# Patient Record
Sex: Male | Born: 1980 | Race: White | Hispanic: No | Marital: Single | Smoking: Current every day smoker
Health system: Southern US, Community
[De-identification: ages and names within clinical notes are randomized; demographics above are authoritative.]

## PROBLEM LIST (undated history)

## (undated) DIAGNOSIS — C8310 Mantle cell lymphoma, unspecified site: Secondary | ICD-10-CM

## (undated) DIAGNOSIS — C8370 Burkitt lymphoma, unspecified site: Secondary | ICD-10-CM

## (undated) HISTORY — PX: OTHER SURGICAL HISTORY: SHX169

---
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
## 1999-01-05 ENCOUNTER — Emergency Department (HOSPITAL_COMMUNITY): Admission: EM | Admit: 1999-01-05 | Discharge: 1999-01-05 | Payer: Self-pay | Admitting: Emergency Medicine

## 1999-01-05 ENCOUNTER — Encounter: Payer: Self-pay | Admitting: Emergency Medicine

## 1999-01-20 ENCOUNTER — Encounter: Admission: RE | Admit: 1999-01-20 | Discharge: 1999-01-20 | Payer: Self-pay | Admitting: Family Medicine

## 1999-11-21 ENCOUNTER — Emergency Department (HOSPITAL_COMMUNITY): Admission: EM | Admit: 1999-11-21 | Discharge: 1999-11-21 | Payer: Self-pay | Admitting: *Deleted

## 1999-11-21 ENCOUNTER — Encounter: Payer: Self-pay | Admitting: Family Medicine

## 1999-12-15 ENCOUNTER — Encounter: Admission: RE | Admit: 1999-12-15 | Discharge: 1999-12-15 | Payer: Self-pay | Admitting: Family Medicine

## 2000-09-13 ENCOUNTER — Encounter: Payer: Self-pay | Admitting: Emergency Medicine

## 2000-09-13 ENCOUNTER — Emergency Department (HOSPITAL_COMMUNITY): Admission: EM | Admit: 2000-09-13 | Discharge: 2000-09-13 | Payer: Self-pay | Admitting: Emergency Medicine

## 2002-07-07 ENCOUNTER — Emergency Department (HOSPITAL_COMMUNITY): Admission: EM | Admit: 2002-07-07 | Discharge: 2002-07-07 | Payer: Self-pay | Admitting: *Deleted

## 2012-02-21 ENCOUNTER — Encounter (HOSPITAL_COMMUNITY): Payer: Self-pay | Admitting: Emergency Medicine

## 2012-02-21 ENCOUNTER — Emergency Department (HOSPITAL_COMMUNITY)
Admission: EM | Admit: 2012-02-21 | Discharge: 2012-02-22 | Disposition: A | Discharge status: Home / Self Care | Payer: Self-pay | Attending: Emergency Medicine | Admitting: Emergency Medicine

## 2012-02-21 DIAGNOSIS — F17200 Nicotine dependence, unspecified, uncomplicated: Secondary | ICD-10-CM | POA: Insufficient documentation

## 2012-02-21 DIAGNOSIS — R070 Pain in throat: Secondary | ICD-10-CM | POA: Insufficient documentation

## 2012-02-21 DIAGNOSIS — R599 Enlarged lymph nodes, unspecified: Secondary | ICD-10-CM | POA: Insufficient documentation

## 2012-02-21 DIAGNOSIS — J36 Peritonsillar abscess: Secondary | ICD-10-CM | POA: Insufficient documentation

## 2012-02-21 DIAGNOSIS — Z8589 Personal history of malignant neoplasm of other organs and systems: Secondary | ICD-10-CM | POA: Insufficient documentation

## 2012-02-21 NOTE — ED Notes (Signed)
PT. REPORTS SORE THROAT / SWELLING ONSET TODAY , RESPIRATIONS UNLABORED , DENIES FEVER OR CHILLS.

## 2012-02-22 ENCOUNTER — Emergency Department (HOSPITAL_COMMUNITY): Payer: Self-pay

## 2012-02-22 LAB — BASIC METABOLIC PANEL
BUN: 7 mg/dL (ref 6–23)
CO2: 26 mEq/L (ref 19–32)
Calcium: 8.9 mg/dL (ref 8.4–10.5)
Chloride: 102 mEq/L (ref 96–112)
Creatinine, Ser: 0.84 mg/dL (ref 0.50–1.35)
GFR calc Af Amer: >90 mL/min (ref >90)
GFR calc non Af Amer: >90 mL/min (ref >90)
Glucose, Bld: 105 mg/dL — ABNORMAL HIGH (ref 70–99)
Potassium: 3.3 mEq/L — ABNORMAL LOW (ref 3.5–5.1)
Sodium: 137 mEq/L (ref 135–145)

## 2012-02-22 LAB — CBC
HCT: 36.4 % — ABNORMAL LOW (ref 39.0–52.0)
Hemoglobin: 12.8 g/dL — ABNORMAL LOW (ref 13.0–17.0)
MCH: 31.2 pg (ref 26.0–34.0)
MCHC: 35.2 g/dL (ref 30.0–36.0)
MCV: 88.8 fL (ref 78.0–100.0)
Platelets: 266 10*3/uL (ref 150–400)
RBC: 4.1 MIL/uL — ABNORMAL LOW (ref 4.22–5.81)
RDW: 13.7 % (ref 11.5–15.5)
WBC: 26.1 10*3/uL — ABNORMAL HIGH (ref 4.0–10.5)

## 2012-02-22 MED ORDER — OXYCODONE-ACETAMINOPHEN 5-325 MG PO TABS: 1.0000 | ORAL_TABLET | ORAL | Status: AC | PRN

## 2012-02-22 MED ORDER — CLINDAMYCIN PHOSPHATE 600 MG/50ML IV SOLN
INTRAVENOUS | Status: AC
  Filled 2012-02-22: qty 50

## 2012-02-22 MED ORDER — DEXAMETHASONE SODIUM PHOSPHATE 10 MG/ML IJ SOLN
10.0000 mg | Freq: Once | INTRAMUSCULAR | Status: AC
  Administered 2012-02-22: 10 mg via INTRAVENOUS
  Filled 2012-02-22: qty 1

## 2012-02-22 MED ORDER — IOHEXOL 300 MG/ML  SOLN
75.0000 mL | Freq: Once | INTRAMUSCULAR | Status: AC | PRN
  Administered 2012-02-22: 75 mL via INTRAVENOUS

## 2012-02-22 MED ORDER — KETOROLAC TROMETHAMINE 15 MG/ML IJ SOLN
15.0000 mg | INTRAMUSCULAR | Status: DC
  Filled 2012-02-22: qty 1

## 2012-02-22 MED ORDER — CLINDAMYCIN PHOSPHATE 900 MG/50ML IV SOLN
900.0000 mg | INTRAVENOUS | Status: AC
  Administered 2012-02-22: 900 mg via INTRAVENOUS
  Filled 2012-02-22 (×2): qty 50

## 2012-02-22 MED ORDER — IBUPROFEN 400 MG PO TABS: 400.0000 mg | ORAL_TABLET | Freq: Four times a day (QID) | ORAL | Status: AC | PRN

## 2012-02-22 MED ORDER — CLINDAMYCIN HCL 150 MG PO CAPS: 300.0000 mg | ORAL_CAPSULE | Freq: Three times a day (TID) | ORAL | Status: AC

## 2012-02-22 MED ORDER — SODIUM CHLORIDE 0.9 % IV BOLUS (SEPSIS)
1000.0000 mL | Freq: Once | INTRAVENOUS | Status: AC
  Administered 2012-02-22: 1000 mL via INTRAVENOUS

## 2012-02-22 MED ORDER — KETOROLAC TROMETHAMINE 30 MG/ML IJ SOLN
INTRAMUSCULAR | Status: AC
  Administered 2012-02-22: 15 mg
  Filled 2012-02-22: qty 1

## 2012-02-22 MED ORDER — HYDROMORPHONE HCL PF 1 MG/ML IJ SOLN
1.0000 mg | Freq: Once | INTRAMUSCULAR | Status: AC
  Administered 2012-02-22: 1 mg via INTRAVENOUS
  Filled 2012-02-22: qty 1

## 2012-02-22 NOTE — ED Provider Notes (Signed)
History    31 year old male with sore throat. Gradual onset about a day ago. Progressively worsening. No fevers or chills. Denies shortness of breath.  Decreased PO because of pain. No n/v. Is a smoker. No drooling.  CSN: [id number]  Arrival date & time 02/21/12  2255   First MD Initiated Contact with Patient 02/22/12 0134      Chief Complaint  Patient presents with  . Sore Throat    (Consider location/radiation/quality/duration/timing/severity/associated sxs/prior treatment) HPI  Past Medical History  Diagnosis Date  . Spleen cancer     Past Surgical History  Procedure Date  . Spleenec     No family history on file.  History  Substance Use Topics  . Smoking status: Current Everyday Smoker  . Smokeless tobacco: Not on file  . Alcohol Use: Yes      Review of Systems   Review of symptoms negative unless otherwise noted in HPI.   Allergies  Review of patient's allergies indicates no known allergies.  Home Medications   Current Outpatient Rx  Name Route Sig Dispense Refill  . ACETAMINOPHEN 325 MG PO TABS Oral Take 650 mg by mouth every 6 (six) hours as needed. For pain    . AMOXICILLIN 500 MG PO CAPS Oral Take 500 mg by mouth 2 (two) times daily.      BP 129/77  Pulse 102  Temp(Src) 99.8 F (37.7 C) (Oral)  Resp 20  SpO2 99%  Physical Exam  Nursing note and vitals reviewed. Constitutional: He appears well-developed and well-nourished. No distress.  HENT:  Head: Normocephalic and atraumatic.       Bulging of the right palatopharyngeal arch with deviation of the uvula to the left. Tonsillar hypertrophy with exudate. Patient is handling his secretions. There is no tongue elevation. Submental tissues are soft. Tender cervical adenopathy. Tympanic membranes are clear bilaterally. External auditory canals are clear as well.  Eyes: Conjunctivae are normal. Right eye exhibits no discharge. Left eye exhibits no discharge.  Neck: Neck supple. No JVD present. No  tracheal deviation present.  Cardiovascular: Normal rate, regular rhythm and normal heart sounds.  Exam reveals no gallop and no friction rub.   No murmur heard. Pulmonary/Chest: Effort normal and breath sounds normal. No stridor. No respiratory distress.       Lungs are clear respiratory effort is normal.  Abdominal: Soft. He exhibits no distension. There is no tenderness.  Musculoskeletal: He exhibits no edema and no tenderness.  Lymphadenopathy:    He has cervical adenopathy.  Neurological: He is alert.  Skin: Skin is warm and dry.  Psychiatric: He has a normal mood and affect. His behavior is normal. Thought content normal.    ED Course  Procedures (including critical care time)  Labs Reviewed  BASIC METABOLIC PANEL - Abnormal; Notable for the following:    Potassium 3.3 (*)    Glucose, Bld 105 (*)    All other components within normal limits  CBC - Abnormal; Notable for the following:    WBC 26.1 (*)    RBC 4.10 (*)    Hemoglobin 12.8 (*)    HCT 36.4 (*)    All other components within normal limits   No results found.  Ct Soft Tissue Neck W Contrast  02/22/2012  *RADIOLOGY REPORT*  Clinical Data: Palpable mass lateral right neck, sore throat, right tonsillar enlargement/swelling with redness, question peritonsillar abscess  CT NECK WITH CONTRAST  Technique:  Multidetector CT imaging of the neck was performed with intravenous contrast. Sagittal  and coronal MPR images reconstructed from axial data set.  Contrast: 75mL OMNIPAQUE IOHEXOL 300 MG/ML IJ SOLN  Comparison: None  Findings: Intracranial structures unremarkable. Question mucosal retention cyst versus polyps left maxillary sinus. Significant enlargement of parapharyngeal lymphoid tissue, right greater than left. Effacement of right peritonsillar/parapharyngeal space soft tissue planes compatible with edema/phlegmon. No discrete fluid collection or enhancing margins are identified to suggest a mature drainable peritonsillar  abscess. Reactive lymph nodes in the cervical regions bilaterally, normal- sized and enlarged. Prevertebral soft tissues normal thickness.  No other abnormal mass or fluid collection identified. Edema seen within the soft tissues in the right cervical region without discrete abscess, deep to the platysma. Symmetric submandibular and parotid glands. Unremarkable thyroid lobes. Lung apices clear.  IMPRESSION: Significant enlargement of parapharyngeal soft tissues right greater than left with phlegmon in the right parapharyngeal space. No discrete drainable abscess collection identified. Extensive reactive adenopathy with scattered edema within deep soft tissues of the right neck.  Findings called to Dr. Juleen China on 02/22/2012 at 0318 hrs.  Original Report Authenticated By: Lollie Marrow, M.D.   3:20 AM Discuss case with radiology. Patient has phlegmon, but no drainable abscess at this time.  1. Peritonsillar cellulitis       MDM  31 year old male with sore throat. There was a concern given exam for possible peritonsillar abscess. CT was performed. There is a peritonsillar cellulitis, but no discrete drainable abscess. Patient reports improvement of symptoms with meds. Patient is handling his secretions and is experiencing no respiratory distress. Dose of clindamycin was given. Prescription for continued antibiotics. Pain medication as needed. Return precautions were discussed. Outpatient followup.        Raeford Razor, MD 02/28/12 1755

## 2012-02-22 NOTE — ED Notes (Signed)
Annabelle Harman (pt's significant other) [phone]

## 2012-02-22 NOTE — ED Notes (Signed)
Palpable mass to rgt lateral neck area - states onset last PM; reports sore throat with "white patches" x1 week; recent sick contacts; upon visualization, rgt tonsillar area extremely swollen with cherry redness noted; reports tactile fevers at home; however, did not check via thermometer; airway intact; able to control oral secretions; in no resp distress at this time

## 2012-02-22 NOTE — Discharge Instructions (Signed)
Peritonsillar Cellulitis Peritonsillar cellulitis is an infection around a tonsil. This infection usually affects just one of the two tonsils. The result is a severe sore throat. Peritonsillar cellulitis can develop at any age. It often develops in individuals who have had frequent sore throats and who have frequently taken antibiotics. CAUSES  Peritonsillar cellulitis is usually caused by more than one type of germ (bacteria). SYMPTOMS  At first, it might seem like a regular sore throat. But a sore throat from peritonsillar cellulitis does not go away in a few days. Instead, it gets worse.  Early symptoms of peritonsillar cellulitis may include:   Fever and/or chills.   A throat that is sore on one side only.   Pain in one ear.   Pain when swallowing.   Feeling more tired than usual.   Later symptoms may include:   Severe pain when swallowing.   Drooling.   Trouble opening the mouth wide.   Bad breath.   Voice changes.  DIAGNOSIS  In most cases, your caregiver can make the diagnosis by knowing your symptoms, examining your throat and getting a throat culture. Blood samples may also help to determine the cause of your sore throat. TREATMENT  This is not an ordinary sore throat. It is a condition that needs to be treated quickly. If it is not treated, swelling and pus (an abcess) can develop.  Peritonsillar cellulitis is usually treated with antibiotics. These infections require oral antibiotics for a full 10 days and/or antibiotics given into the vein (intravenous, IV).   Medications may be prescribed for pain or fever.   Sometimes, medications that fight swelling (steroids) are prescribed.   If an abscess has formed, the abscess may need to be drained.   Individuals who have repeated cases of peritonsillar cellulitis may need an operation to remove the tonsils (tonsillectomy).  HOME CARE INSTRUCTIONS   Take antibiotics as directed by your caregiver. Take all the  antibiotics, even if you start to feel better.   Some pain is normal with this condition. Take pain medication as directed by your caregiver. Do not take any other pain medications unless approved by your caregiver.   Gargle with warm salt water. Use 1 teaspoon (5 grams) salt mixed in 1 cup (250 mL) of warm water. Gargle for 30 seconds or more before spitting the solution out. Gargle 3 to 4 times a day or as needed. This may help ease pain and swelling.   A liquid or soft food diet may be necessary if it is hard to swallow.   It is important to drink fluids. Drink enough water and fluids to keep your urine clear or pale yellow.   Do not smoke.   Rest and get plenty of sleep.   If your caregiver has given you a follow-up appointment, it is very important to keep that appointment. Your caregiver will need to make sure that the infection is getting better. It is important to check that an abscess is not forming.   Return to work or school as directed by your caregiver.  SEEK MEDICAL CARE IF:   Your swelling increases.   You have difficulty swallowing.   You are unable to take your antibiotic.  SEEK IMMEDIATE MEDICAL CARE IF:   You have trouble breathing.   Your pain gets worse even after taking pain medicine.   You see pus around or near the tonsils.   Your voice changes.   You are drooling.   You cough up bloody  sputum.   You are unable to swallow.   You have a fever.  MAKE SURE YOU:   Understand these instructions.   Will watch your condition.   Will get help right away if you are not doing well or get worse.  Document Released: 03/02/2010 Document Revised: 11/25/2011 Document Reviewed: 03/02/2010 [facility] Patient Information 2012 [place], [place].  RESOURCE GUIDE  Dental Problems  Patients with Medicaid: [facility] [phone] [address]                                           [phone] [address] Phone:   [phone]                                                  Phone:  [phone]  If unable to pay or uninsured, contact:  [facility] or [facility]. to become qualified for the adult dental clinic.  Chronic Pain Problems Contact [facility]  [phone] Patients need to be referred by their primary care doctor.  Insufficient Money for Medicine Contact [employer]:  call "211" or [facility] 4454877571.  No Primary Care Doctor Call [facility]  724-404-1722 Other agencies that provide inexpensive medical care    [facility]  [phone]    [facility]  [phone]    [facility]  [phone]    [facility]  [phone]    [facility]  [phone]    [facility]  [phone]  Psychological Services [facility]  [phone] [facility]  [phone] [facility]   [phone] (emergency services [phone])  Substance Abuse Resources [facility]  [phone] [facility] ([phone] 407-4089 [facility] ([phone] Floydene Flock [phone] Residential & Outpatient Substance Abuse Program  5677447757  Abuse/Neglect [facility] Child Abuse Hotline 339-785-7166 [facility] Child Abuse Hotline 660-629-7024 (After Hours)  Emergency Shelter [facility] 830-495-3560  M[facility] [phone] [facility] [phone]  MRSA Hotline #:   570-590-6474    [facility] Resources  [facility]     [employer]                          [facility]. [address]                        [address]      [address]                                                  Cristobal Goldmann Phone:  [phone]  Phone:  [phone]                 Phone:   [phone]  [facility] Phone:  [phone]  [place] Child Abuse Hotline (336) 342-1394 [phone] (After Hours)   

## 2012-02-22 NOTE — ED Notes (Signed)
Patient transported to CT 

## 2012-05-24 ENCOUNTER — Encounter (HOSPITAL_COMMUNITY): Payer: Self-pay | Admitting: Emergency Medicine

## 2012-05-24 ENCOUNTER — Emergency Department (HOSPITAL_COMMUNITY)
Admission: EM | Admit: 2012-05-24 | Discharge: 2012-05-24 | Disposition: A | Discharge status: Home / Self Care | Payer: Self-pay | Attending: Emergency Medicine | Admitting: Emergency Medicine

## 2012-05-24 DIAGNOSIS — K0889 Other specified disorders of teeth and supporting structures: Secondary | ICD-10-CM

## 2012-05-24 DIAGNOSIS — K0381 Cracked tooth: Secondary | ICD-10-CM | POA: Insufficient documentation

## 2012-05-24 DIAGNOSIS — F17200 Nicotine dependence, unspecified, uncomplicated: Secondary | ICD-10-CM | POA: Insufficient documentation

## 2012-05-24 MED ORDER — OXYCODONE-ACETAMINOPHEN 5-325 MG PO TABS: 2.0000 | ORAL_TABLET | ORAL | Status: AC | PRN

## 2012-05-24 MED ORDER — OXYCODONE-ACETAMINOPHEN 5-325 MG PO TABS
2.0000 | ORAL_TABLET | Freq: Once | ORAL | Status: AC
  Administered 2012-05-24: 2 via ORAL
  Filled 2012-05-24: qty 2

## 2012-05-24 MED ORDER — PENICILLIN V POTASSIUM 500 MG PO TABS: 500.0000 mg | ORAL_TABLET | Freq: Four times a day (QID) | ORAL | Status: AC

## 2012-05-24 NOTE — ED Provider Notes (Signed)
History     CSN: [id number]  Arrival date & time 05/24/12  2027   First MD Initiated Contact with Patient 05/24/12 2044      Chief Complaint  Patient presents with  . Dental Pain    (Consider location/radiation/quality/duration/timing/severity/associated sxs/prior treatment) HPI Comments: Right lower molar dental pain for the past 3 days. No history of injury, fever, chills. No chest pain, shortness of breath, difficulty breathing or swallowing. Is not have a dentist. 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  The history is provided by the patient.    Past Medical History  Diagnosis Date  . Spleen cancer     Past Surgical History  Procedure Date  . Spleenec     No family history on file.  History  Substance Use Topics  . Smoking status: Current Everyday Smoker  . Smokeless tobacco: Not on file  . Alcohol Use: Yes      Review of Systems  Constitutional: Negative for fever.  HENT: Positive for dental problem.   Respiratory: Negative for cough, chest tightness and shortness of breath.   Cardiovascular: Negative for chest pain.  Gastrointestinal: Negative for nausea, vomiting and abdominal pain.  Genitourinary: Negative for dysuria and hematuria.  Musculoskeletal: Negative for back pain.  Neurological: Negative for dizziness and headaches.    Allergies  Review of patient's allergies indicates no known allergies.  Home Medications   Current Outpatient Rx  Name Route Sig Dispense Refill  . ACETAMINOPHEN 325 MG PO TABS Oral Take 650 mg by mouth every 6 (six) hours as needed. For pain      BP 129/61  Pulse 83  Temp(Src) 98 F (36.7 C) (Oral)  Resp 16  SpO2 97%  Physical Exam  Constitutional: He is oriented to person, place, and time. He appears well-developed and well-nourished. No distress.  HENT:  Head: Normocephalic and atraumatic.  Mouth/Throat: Oropharynx is clear and moist. No  oropharyngeal exudate.    Eyes: Conjunctivae and EOM are normal. Pupils are equal, round, and reactive to light.  Neck: Normal range of motion. Neck supple.  Cardiovascular: Normal rate, regular rhythm and normal heart sounds.   Pulmonary/Chest: Effort normal and breath sounds normal. No respiratory distress.  Abdominal: Soft. There is no tenderness. There is no rebound and no guarding.  Musculoskeletal: Normal range of motion. He exhibits no edema and no tenderness.  Neurological: He is alert and oriented to person, place, and time. No cranial nerve deficit.  Skin: Skin is warm.    ED Course  Procedures (including critical care time)  Labs Reviewed - No data to display No results found.   No diagnosis found.    MDM  Dental pain secondary to fractured tooth without abscess. No tongue elevation, handling secretions. No evidence of Ludwig angina.  Pain medication, antibiotics, followup with dentistry       Glynn Octave, MD 05/24/12 2106

## 2012-05-24 NOTE — ED Notes (Signed)
C/o toothache x 3 days 

## 2012-05-24 NOTE — Discharge Instructions (Signed)
Dental Pain  A tooth ache may be caused by cavities (tooth decay). Cavities expose the nerve of the tooth to air and hot or cold temperatures. It may come from an infection or abscess (also called a boil or furuncle) around your tooth. It is also often caused by dental caries (tooth decay). This causes the pain you are having.  DIAGNOSIS   Your caregiver can diagnose this problem by exam.  TREATMENT   · If caused by an infection, it may be treated with medications which kill germs (antibiotics) and pain medications as prescribed by your caregiver. Take medications as directed.  · Only take over-the-counter or prescription medicines for pain, discomfort, or fever as directed by your caregiver.  · Whether the tooth ache today is caused by infection or dental disease, you should see your dentist as soon as possible for further care.  SEEK MEDICAL CARE IF:  The exam and treatment you received today has been provided on an emergency basis only. This is not a substitute for complete medical or dental care. If your problem worsens or new problems (symptoms) appear, and you are unable to meet with your dentist, call or return to this location.  SEEK IMMEDIATE MEDICAL CARE IF:   · You have a fever.  · You develop redness and swelling of your face, jaw, or neck.  · You are unable to open your mouth.  · You have severe pain uncontrolled by pain medicine.  MAKE SURE YOU:   · Understand these instructions.  · Will watch your condition.  · Will get help right away if you are not doing well or get worse.  Document Released: 12/06/2005 Document Revised: 11/25/2011 Document Reviewed: 07/24/2008  [employer]® Patient Information ©2012 [employer].

## 2012-05-29 ENCOUNTER — Encounter (HOSPITAL_COMMUNITY): Payer: Self-pay

## 2012-05-29 ENCOUNTER — Emergency Department (HOSPITAL_COMMUNITY)
Admission: EM | Admit: 2012-05-29 | Discharge: 2012-05-29 | Disposition: A | Discharge status: Home / Self Care | Payer: Self-pay | Attending: Emergency Medicine | Admitting: Emergency Medicine

## 2012-05-29 DIAGNOSIS — K0889 Other specified disorders of teeth and supporting structures: Secondary | ICD-10-CM

## 2012-05-29 DIAGNOSIS — R112 Nausea with vomiting, unspecified: Secondary | ICD-10-CM | POA: Insufficient documentation

## 2012-05-29 DIAGNOSIS — Z8589 Personal history of malignant neoplasm of other organs and systems: Secondary | ICD-10-CM | POA: Insufficient documentation

## 2012-05-29 DIAGNOSIS — F17200 Nicotine dependence, unspecified, uncomplicated: Secondary | ICD-10-CM | POA: Insufficient documentation

## 2012-05-29 DIAGNOSIS — K089 Disorder of teeth and supporting structures, unspecified: Secondary | ICD-10-CM | POA: Insufficient documentation

## 2012-05-29 DIAGNOSIS — K08409 Partial loss of teeth, unspecified cause, unspecified class: Secondary | ICD-10-CM | POA: Insufficient documentation

## 2012-05-29 DIAGNOSIS — K08109 Complete loss of teeth, unspecified cause, unspecified class: Secondary | ICD-10-CM | POA: Insufficient documentation

## 2012-05-29 NOTE — ED Notes (Signed)
Pt. Had  Rt., molar removed on Thursday, and has had pain since   Rt. Facial swelling noted

## 2012-05-29 NOTE — Discharge Instructions (Signed)
PLEASE CALL YOUR DENTIST TODAY FOR FURTHER CARE/PAIN CONTROL

## 2012-05-29 NOTE — ED Provider Notes (Signed)
History    This chart was scribed for Austin Gaskins, MD, MD by Austin Townsend. The patient was seen in room STRE4 and the patient's care was started at 12:42PM.   CSN: [id number]  Arrival date & time 05/29/12  1200   First MD Initiated Contact with Patient 05/29/12 1236      Chief Complaint  Patient presents with  . Dental Pain    ( Patient is a 31 y.o. male presenting with tooth pain. The history is provided by the patient.  Dental PainPrimary symptoms do not include fever, shortness of breath or cough.  Additional symptoms include: facial swelling.   Austin Townsend is a 31 y.o. male who presents to the Emergency Department complaining of moderate right lower dental pain 3 days ago. Pt reports that the right lower moalr tooth was extracted 3 days ago and the pain has been constant since then. Pt has been using abx and pain medication without relief. Pt has had vomiting and nausea. Pt had back pain. Denies fever.   Past Medical History  Diagnosis Date  . Spleen cancer     Past Surgical History  Procedure Date  . Spleenec     No family history on file.  History  Substance Use Topics  . Smoking status: Current Everyday Smoker  . Smokeless tobacco: Not on file  . Alcohol Use: Yes      Review of Systems  Constitutional: Negative for fever.  HENT: Positive for facial swelling. Negative for neck pain.   Respiratory: Negative for cough and shortness of breath.   Gastrointestinal: Positive for nausea and vomiting. Negative for diarrhea.  Musculoskeletal: Positive for back pain.    Allergies  Review of patient's allergies indicates no known allergies.  Home Medications   Current Outpatient Rx  Name Route Sig Dispense Refill  . OXYCODONE-ACETAMINOPHEN 5-325 MG PO TABS Oral Take 2 tablets by mouth every 4 (four) hours as needed for pain. 15 tablet 0  . PENICILLIN V POTASSIUM 500 MG PO TABS Oral Take 1 tablet (500 mg total) by mouth 4 (four) times daily. 40 tablet 0     BP 121/83  Pulse 74  Temp(Src) 98.1 F (36.7 C) (Oral)  Resp 16  SpO2 100%  Physical Exam  Nursing note and vitals reviewed.  CONSTITUTIONAL: Well developed/well nourished HEAD AND FACE: Normocephalic/atraumatic EYES: EOMI/PERRL ENMT: Mucous membranes moist, tenderness at right lower molar extraction site, no bleeding, no abscess, no trismus  NECK: supple no meningeal signs SPINE:entire spine nontender CV: S1/S2 noted, no murmurs/rubs/gallops noted LUNGS: Lungs are clear to auscultation bilaterally, no apparent distress ABDOMEN: soft, nontender, no rebound or guarding GU:no cva tenderness NEURO: Pt is awake/alert, moves all extremitiesx4 EXTREMITIES: pulses normal, full ROM SKIN: warm, color normal PSYCH: no abnormalities of mood noted  ED Course  Dental Performed by: Austin Townsend Authorized by: Austin Townsend Consent: Verbal consent obtained. Patient sedated: no Patient tolerance: Patient tolerated the procedure well with no immediate complications. Comments: Dry socket paste placed to right lower molar extraction site, moderate pain relief noted    DIAGNOSTIC STUDIES: Oxygen Saturation is 100% on room air, normal by my interpretation.    COORDINATION OF CARE: 12:46PM EDP discusses pt ED treatment with pt.      MDM  Nursing notes including past medical history, social history and family history reviewed and considered in documentation Previous records reviewed and considered Narcotic database reviewed      I personally performed the services described in this  documentation, which was scribed in my presence. The recorded information has been reviewed and considered.      Austin Gaskins, MD 05/29/12 [phone]

## 2012-08-14 ENCOUNTER — Encounter (HOSPITAL_COMMUNITY): Payer: Self-pay | Admitting: *Deleted

## 2012-08-14 ENCOUNTER — Emergency Department (HOSPITAL_COMMUNITY)
Admission: EM | Admit: 2012-08-14 | Discharge: 2012-08-14 | Disposition: A | Discharge status: Home / Self Care | Payer: Self-pay | Attending: Emergency Medicine | Admitting: Emergency Medicine

## 2012-08-14 DIAGNOSIS — F17200 Nicotine dependence, unspecified, uncomplicated: Secondary | ICD-10-CM | POA: Insufficient documentation

## 2012-08-14 DIAGNOSIS — Z87898 Personal history of other specified conditions: Secondary | ICD-10-CM | POA: Insufficient documentation

## 2012-08-14 DIAGNOSIS — S51819A Laceration without foreign body of unspecified forearm, initial encounter: Secondary | ICD-10-CM

## 2012-08-14 DIAGNOSIS — S51809A Unspecified open wound of unspecified forearm, initial encounter: Secondary | ICD-10-CM | POA: Insufficient documentation

## 2012-08-14 MED ORDER — TRAMADOL HCL 50 MG PO TABS
50.0000 mg | ORAL_TABLET | Freq: Once | ORAL | Status: AC
  Administered 2012-08-14: 50 mg via ORAL
  Filled 2012-08-14: qty 1

## 2012-08-14 MED ORDER — TRAMADOL HCL 50 MG PO TABS: 50.0000 mg | ORAL_TABLET | Freq: Four times a day (QID) | ORAL | Status: AC | PRN
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
## 2012-08-14 NOTE — ED Notes (Signed)
Rx x 1.  Pt voiced understanding to f/u with PCP and return for staple removal in 7-10 days.

## 2012-08-14 NOTE — ED Notes (Signed)
Called for pt with no answer--pt told RN first he was going to step outside for a minute--attempted to locate outside without success; will call again in about 15 minutes

## 2013-01-30 ENCOUNTER — Emergency Department: Payer: Self-pay | Admitting: Unknown Physician Specialty

## 2013-01-30 LAB — URINALYSIS, COMPLETE
Bilirubin,UR: NEGATIVE
Glucose,UR: NEGATIVE mg/dL (ref 0–75)
Ketone: NEGATIVE
Leukocyte Esterase: NEGATIVE
Ph: 5 (ref 4.5–8.0)
Specific Gravity: 1.025 (ref 1.003–1.030)
Squamous Epithelial: <1

## 2013-01-30 LAB — COMPREHENSIVE METABOLIC PANEL
Albumin: 4.1 g/dL (ref 3.4–5.0)
Alkaline Phosphatase: 75 U/L (ref 50–136)
Anion Gap: 7 (ref 7–16)
Calcium, Total: 8.9 mg/dL (ref 8.5–10.1)
Chloride: 107 mmol/L (ref 98–107)
Creatinine: 1.1 mg/dL (ref 0.60–1.30)
EGFR (Non-African Amer.): >60
Glucose: 87 mg/dL (ref 65–99)
Osmolality: 283 (ref 275–301)
Potassium: 3.5 mmol/L (ref 3.5–5.1)
SGPT (ALT): 31 U/L (ref 12–78)

## 2013-01-30 LAB — CBC WITH DIFFERENTIAL/PLATELET
Basophil %: 0.3 %
Eosinophil #: 0.1 10*3/uL (ref 0.0–0.7)
Eosinophil %: 0.3 %
Lymphocyte %: 12 %
MCH: 30.2 pg (ref 26.0–34.0)
MCHC: 33.4 g/dL (ref 32.0–36.0)
Monocyte %: 7 %
Neutrophil %: 80.4 %
Platelet: 228 10*3/uL (ref 150–440)
RBC: 4.55 10*6/uL (ref 4.40–5.90)
RDW: 14.2 % (ref 11.5–14.5)

## 2013-01-30 LAB — DRUG SCREEN, URINE
Amphetamines, Ur Screen: NEGATIVE (ref ?–1000)
Cocaine Metabolite,Ur ~~LOC~~: NEGATIVE (ref ?–300)
Methadone, Ur Screen: NEGATIVE (ref ?–300)
Tricyclic, Ur Screen: NEGATIVE (ref ?–1000)

## 2013-01-30 LAB — LIPASE, BLOOD: Lipase: 133 U/L (ref 73–393)

## 2017-04-04 ENCOUNTER — Emergency Department
Admission: EM | Admit: 2017-04-04 | Discharge: 2017-04-04 | Disposition: A | Discharge status: Home / Self Care | Payer: Self-pay | Attending: Emergency Medicine | Admitting: Emergency Medicine

## 2017-04-04 ENCOUNTER — Telehealth: Payer: Self-pay | Admitting: Emergency Medicine

## 2017-04-04 ENCOUNTER — Encounter: Payer: Self-pay | Admitting: Emergency Medicine

## 2017-04-04 ENCOUNTER — Emergency Department: Payer: Self-pay

## 2017-04-04 DIAGNOSIS — M7989 Other specified soft tissue disorders: Secondary | ICD-10-CM | POA: Insufficient documentation

## 2017-04-04 DIAGNOSIS — F17210 Nicotine dependence, cigarettes, uncomplicated: Secondary | ICD-10-CM | POA: Insufficient documentation

## 2017-04-04 DIAGNOSIS — R220 Localized swelling, mass and lump, head: Secondary | ICD-10-CM | POA: Insufficient documentation

## 2017-04-04 DIAGNOSIS — Z5321 Procedure and treatment not carried out due to patient leaving prior to being seen by health care provider: Secondary | ICD-10-CM | POA: Insufficient documentation

## 2017-04-04 DIAGNOSIS — M549 Dorsalgia, unspecified: Secondary | ICD-10-CM | POA: Insufficient documentation

## 2017-04-04 LAB — CBC WITH DIFFERENTIAL/PLATELET
BASOS ABS: 0.1 10*3/uL (ref 0–0.1)
BASOS PCT: 1 %
Eosinophils Absolute: 0.2 10*3/uL (ref 0–0.7)
Eosinophils Relative: 2 %
HEMATOCRIT: 33.8 % — AB (ref 40.0–52.0)
HEMOGLOBIN: 12.3 g/dL — AB (ref 13.0–18.0)
Lymphocytes Relative: 21 %
Lymphs Abs: 1.9 10*3/uL (ref 1.0–3.6)
MCH: 31.7 pg (ref 26.0–34.0)
MCHC: 36.3 g/dL — AB (ref 32.0–36.0)
MCV: 87.4 fL (ref 80.0–100.0)
MONO ABS: 0.9 10*3/uL (ref 0.2–1.0)
Monocytes Relative: 10 %
NEUTROS PCT: 66 %
Neutro Abs: 6.1 10*3/uL (ref 1.4–6.5)
Platelets: 243 10*3/uL (ref 150–440)
RBC: 3.87 MIL/uL — AB (ref 4.40–5.90)
RDW: 13.4 % (ref 11.5–14.5)
WBC: 9.2 10*3/uL (ref 3.8–10.6)

## 2017-04-04 LAB — COMPREHENSIVE METABOLIC PANEL
ALBUMIN: 4.2 g/dL (ref 3.5–5.0)
ALK PHOS: 46 U/L (ref 38–126)
ALT: 26 U/L (ref 17–63)
ANION GAP: 9 (ref 5–15)
AST: 28 U/L (ref 15–41)
BUN: 11 mg/dL (ref 6–20)
CALCIUM: 8.7 mg/dL — AB (ref 8.9–10.3)
CO2: 26 mmol/L (ref 22–32)
CREATININE: 0.73 mg/dL (ref 0.61–1.24)
Chloride: 103 mmol/L (ref 101–111)
GFR calc Af Amer: >60 mL/min (ref >60)
GFR calc non Af Amer: >60 mL/min (ref >60)
GLUCOSE: 101 mg/dL — AB (ref 65–99)
Potassium: 3.1 mmol/L — ABNORMAL LOW (ref 3.5–5.1)
SODIUM: 138 mmol/L (ref 135–145)
TOTAL PROTEIN: 7.4 g/dL (ref 6.5–8.1)
Total Bilirubin: 1.2 mg/dL (ref 0.3–1.2)
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
## 2017-04-04 NOTE — ED Triage Notes (Signed)
Patient limping and in wheelchair to triage with complaints of swelling and pain to right ankle and swelling to right jaw for the last 2 days - constant aching.  Pt reports mid back pain after moving furniture yesterday.  Pt reports taking 4 x 200mg  IBU at 1200 yesterday.  Speaking in complete coherent sentences. No acute breathing distress noted.

## 2017-04-04 NOTE — ED Notes (Signed)
Patient stating he cannot wait to be seen by provider, citing his son needs to go to bed.  I urged him to return to the ER tomorrow, as I thought he had a cellulitic right ankle

## 2019-01-23 ENCOUNTER — Emergency Department (HOSPITAL_COMMUNITY)
Admission: EM | Admit: 2019-01-23 | Discharge: 2019-01-23 | Disposition: A | Discharge status: Home / Self Care | Payer: Self-pay | Attending: Emergency Medicine | Admitting: Emergency Medicine

## 2019-01-23 ENCOUNTER — Emergency Department (HOSPITAL_COMMUNITY): Payer: Self-pay

## 2019-01-23 ENCOUNTER — Encounter (HOSPITAL_COMMUNITY): Payer: Self-pay | Admitting: Emergency Medicine

## 2019-01-23 DIAGNOSIS — R197 Diarrhea, unspecified: Secondary | ICD-10-CM | POA: Insufficient documentation

## 2019-01-23 DIAGNOSIS — F17210 Nicotine dependence, cigarettes, uncomplicated: Secondary | ICD-10-CM | POA: Insufficient documentation

## 2019-01-23 DIAGNOSIS — R112 Nausea with vomiting, unspecified: Secondary | ICD-10-CM | POA: Insufficient documentation

## 2019-01-23 DIAGNOSIS — R1011 Right upper quadrant pain: Secondary | ICD-10-CM | POA: Insufficient documentation

## 2019-01-23 DIAGNOSIS — R059 Cough, unspecified: Secondary | ICD-10-CM

## 2019-01-23 DIAGNOSIS — R05 Cough: Secondary | ICD-10-CM

## 2019-01-23 HISTORY — DX: Mantle cell lymphoma, unspecified site: C83.10

## 2019-01-23 HISTORY — DX: Burkitt lymphoma, unspecified site: C83.70

## 2019-01-23 LAB — CBC WITH DIFFERENTIAL/PLATELET
Abs Immature Granulocytes: 0 10*3/uL (ref 0.00–0.07)
Basophils Absolute: 0 10*3/uL (ref 0.0–0.1)
Basophils Relative: 0 %
Eosinophils Absolute: 0 10*3/uL (ref 0.0–0.5)
Eosinophils Relative: 0 %
HCT: 42.3 % (ref 39.0–52.0)
Hemoglobin: 14.4 g/dL (ref 13.0–17.0)
Lymphocytes Relative: 50 %
Lymphs Abs: 5.3 10*3/uL — ABNORMAL HIGH (ref 0.7–4.0)
MCH: 29.9 pg (ref 26.0–34.0)
MCHC: 34 g/dL (ref 30.0–36.0)
MCV: 87.8 fL (ref 80.0–100.0)
Monocytes Absolute: 0.6 10*3/uL (ref 0.1–1.0)
Monocytes Relative: 6 %
Neutro Abs: 4.7 10*3/uL (ref 1.7–7.7)
Neutrophils Relative %: 44 %
Platelets: 136 10*3/uL — ABNORMAL LOW (ref 150–400)
RBC: 4.82 MIL/uL (ref 4.22–5.81)
RDW: 14.2 % (ref 11.5–15.5)
WBC: 10.6 10*3/uL — ABNORMAL HIGH (ref 4.0–10.5)
nRBC: 0 % (ref 0.0–0.2)
nRBC: 0 /100 WBC

## 2019-01-23 LAB — URINALYSIS, ROUTINE W REFLEX MICROSCOPIC
Bilirubin Urine: NEGATIVE
Glucose, UA: NEGATIVE mg/dL
Ketones, ur: 5 mg/dL — AB
Leukocytes, UA: NEGATIVE
Nitrite: NEGATIVE
Protein, ur: 100 mg/dL — AB
Specific Gravity, Urine: 1.03 (ref 1.005–1.030)
pH: 5 (ref 5.0–8.0)

## 2019-01-23 LAB — COMPREHENSIVE METABOLIC PANEL
ALT: 16 U/L (ref 0–44)
AST: 21 U/L (ref 15–41)
Albumin: 3.7 g/dL (ref 3.5–5.0)
Alkaline Phosphatase: 50 U/L (ref 38–126)
Anion gap: 10 (ref 5–15)
BUN: 11 mg/dL (ref 6–20)
CO2: 26 mmol/L (ref 22–32)
Calcium: 8.5 mg/dL — ABNORMAL LOW (ref 8.9–10.3)
Chloride: 103 mmol/L (ref 98–111)
Creatinine, Ser: 1 mg/dL (ref 0.61–1.24)
GFR calc Af Amer: >60 mL/min (ref >60)
GFR calc non Af Amer: >60 mL/min (ref >60)
Glucose, Bld: 106 mg/dL — ABNORMAL HIGH (ref 70–99)
Potassium: 3.4 mmol/L — ABNORMAL LOW (ref 3.5–5.1)
Sodium: 139 mmol/L (ref 135–145)
Total Bilirubin: 0.7 mg/dL (ref 0.3–1.2)
Total Protein: 7.5 g/dL (ref 6.5–8.1)

## 2019-01-23 LAB — LIPASE, BLOOD: Lipase: 34 U/L (ref 11–51)

## 2019-01-23 MED ORDER — MORPHINE SULFATE (PF) 4 MG/ML IV SOLN: 4.0000 mg | Freq: Once | INTRAVENOUS | Status: DC

## 2019-01-23 MED ORDER — BENZONATATE 100 MG PO CAPS: 100.0000 mg | ORAL_CAPSULE | Freq: Three times a day (TID) | ORAL | 0 refills | Status: AC | PRN

## 2019-01-23 MED ORDER — ONDANSETRON 4 MG PO TBDP: 4.0000 mg | ORAL_TABLET | Freq: Three times a day (TID) | ORAL | 0 refills | Status: AC | PRN

## 2019-01-23 MED ORDER — DICYCLOMINE HCL 20 MG PO TABS: 20.0000 mg | ORAL_TABLET | Freq: Two times a day (BID) | ORAL | 0 refills | Status: AC | PRN

## 2019-01-23 MED ORDER — KETOROLAC TROMETHAMINE 30 MG/ML IJ SOLN
30.0000 mg | Freq: Once | INTRAMUSCULAR | Status: AC
  Administered 2019-01-23: 30 mg via INTRAVENOUS
  Filled 2019-01-23: qty 1

## 2019-01-23 MED ORDER — SODIUM CHLORIDE 0.9 % IV BOLUS
1000.0000 mL | Freq: Once | INTRAVENOUS | Status: AC
  Administered 2019-01-23: 1000 mL via INTRAVENOUS

## 2019-01-23 MED ORDER — MORPHINE SULFATE (PF) 4 MG/ML IV SOLN
4.0000 mg | Freq: Once | INTRAVENOUS | Status: AC
  Administered 2019-01-23: 4 mg via INTRAVENOUS
  Filled 2019-01-23: qty 1

## 2019-01-23 MED ORDER — IOHEXOL 300 MG/ML  SOLN
100.0000 mL | Freq: Once | INTRAMUSCULAR | Status: AC | PRN
  Administered 2019-01-23: 100 mL via INTRAVENOUS

## 2019-01-23 MED ORDER — ONDANSETRON HCL 4 MG/2ML IJ SOLN
4.0000 mg | Freq: Once | INTRAMUSCULAR | Status: AC
  Administered 2019-01-23: 4 mg via INTRAVENOUS
  Filled 2019-01-23: qty 2

## 2019-01-23 NOTE — ED Provider Notes (Signed)
[facility] EMERGENCY DEPARTMENT Provider Note   CSN: [id number] Arrival date & time: 01/23/19  9735     History   Chief Complaint Chief Complaint  Patient presents with  . Emesis  . Abdominal Pain    HPI Austin Townsend is a 38 y.o. male with history of childhood lymphoma status post splenectomy presents for evaluation of acute onset, progressively worsening cough, nausea, vomiting, diarrhea for 4 days.  Report subjective fevers and chills.  Has had multiple episodes of nonbloody, bilious emesis and watery diarrhea.  More recently noted light red blood in the commode.  Notes sharp abdominal pain to the bilateral flanks, worse on the right.  Also notes nonproductive cough.  Some chest pain with cough only.  Has been taking a an over-the-counter cough medicine and ibuprofen without significant relief.  Denies urinary symptoms.  The history is provided by the patient.    Past Medical History:  Diagnosis Date  . 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   Past Surgical History:  Procedure Laterality Date  . SPLEENEC          Home Medications    Prior to Admission medications   Medication Sig Start Date End Date Taking? Authorizing Provider  benzonatate (TESSALON) 100 MG capsule Take 1 capsule (100 mg total) by mouth 3 (three) times daily as needed for cough. 01/23/19   Zayin Valadez A, PA-C  dicyclomine (BENTYL) 20 MG tablet Take 1 tablet (20 mg total) by mouth 2 (two) times daily as needed for spasms. 01/23/19   Nils Flack, Fredericka Bottcher A, PA-C  ondansetron (ZOFRAN ODT) 4 MG disintegrating tablet Take 1 tablet (4 mg total) by mouth every 8 (eight) hours as needed for nausea or vomiting. 01/23/19   Renita Papa, PA-C    Family History No family history on file.  Social History Social History   Tobacco Use  . Smoking status: Current Every Day Smoker    Packs/day:  1.00    Types: Cigarettes  . Smokeless tobacco: Never Used  Substance Use Topics  . Alcohol use: Yes    Comment: occ  . Drug use: No     Allergies   Patient has no known allergies.   Review of Systems Review of Systems  Constitutional: Positive for chills and fever.  Respiratory: Positive for cough.   Cardiovascular: Positive for chest pain (with cough).  Gastrointestinal: Positive for abdominal pain, diarrhea, nausea and vomiting.  Genitourinary: Negative for dysuria, frequency, hematuria and urgency.  All other systems reviewed and are negative.    Physical Exam Updated Vital Signs BP 118/71   Pulse 70   Temp 97.8 F (36.6 C) (Oral)   Resp 18   SpO2 98%   Physical Exam Vitals signs and nursing note reviewed.  Constitutional:      General: He is not in acute distress.    Appearance: He is well-developed. He is ill-appearing.  HENT:     Head: Normocephalic and atraumatic.  Eyes:     General:        Right eye: No discharge.        Left eye: No discharge.     Conjunctiva/sclera: Conjunctivae normal.  Neck:     Vascular: No JVD.     Trachea: No tracheal deviation.  Cardiovascular:     Rate and Rhythm: Normal  rate.     Heart sounds: Normal heart sounds.  Pulmonary:     Effort: Pulmonary effort is normal.     Breath sounds: Normal breath sounds.  Abdominal:     General: Abdomen is flat. A surgical scar is present. Bowel sounds are normal. There is no distension.     Palpations: Abdomen is soft.     Tenderness: There is generalized abdominal tenderness and tenderness in the right upper quadrant and left upper quadrant. There is left CVA tenderness and guarding. There is no right CVA tenderness or rebound. Positive signs include Murphy's sign. Negative signs include Rovsing's sign, McBurney's sign and psoas sign.  Skin:    General: Skin is warm and dry.     Findings: No erythema.  Neurological:     Mental Status: He is alert.  Psychiatric:        Behavior:  Behavior normal.      ED Treatments / Results  Labs (all labs ordered are listed, but only abnormal results are displayed) Labs Reviewed  CBC WITH DIFFERENTIAL/PLATELET - Abnormal; Notable for the following components:      Result Value   WBC 10.6 (*)    Platelets 136 (*)    Lymphs Abs 5.3 (*)    All other components within normal limits  COMPREHENSIVE METABOLIC PANEL - Abnormal; Notable for the following components:   Potassium 3.4 (*)    Glucose, Bld 106 (*)    Calcium 8.5 (*)    All other components within normal limits  URINALYSIS, ROUTINE W REFLEX MICROSCOPIC - Abnormal; Notable for the following components:   Color, Urine AMBER (*)    APPearance HAZY (*)    Hgb urine dipstick SMALL (*)    Ketones, ur 5 (*)    Protein, ur 100 (*)    Bacteria, UA FEW (*)    All other components within normal limits  LIPASE, BLOOD    EKG None  Radiology Dg Chest 2 View  Result Date: 01/23/2019 CLINICAL DATA:  Chest pain EXAM: CHEST - 2 VIEW COMPARISON:  Chest CT January 30, 2013 FINDINGS: There is no edema or consolidation. The heart size and pulmonary vascularity are normal. No adenopathy. No bone lesions. IMPRESSION: No edema or consolidation. Electronically Signed   By: Lowella Grip III M.D.   On: 01/23/2019 10:55   Ct Abdomen Pelvis W Contrast  Result Date: 01/23/2019 CLINICAL DATA:  38 year old male with acute RIGHT abdominal and pain with nausea/vomiting/diarrhea and fever for 4 days. EXAM: CT ABDOMEN AND PELVIS WITH CONTRAST TECHNIQUE: Multidetector CT imaging of the abdomen and pelvis was performed using the standard protocol following bolus administration of intravenous contrast. CONTRAST:  160mL OMNIPAQUE IOHEXOL 300 MG/ML  SOLN COMPARISON:  01/30/2013 CT FINDINGS: Lower chest: No acute abnormality. Hepatobiliary: The liver is heterogeneous without discrete focal hepatic abnormalities. The gallbladder is unremarkable. No biliary dilatation. Pancreas: Unremarkable Spleen: Not  visualized except for possible tiny splenic tissue in the LEFT UPPER abdomen. Adrenals/Urinary Tract: The kidneys, adrenal glands and bladder are unremarkable except for small renal cysts. Stomach/Bowel: Stomach is within normal limits. Appendix not identified. No evidence of bowel wall thickening, distention, or inflammatory changes. Vascular/Lymphatic: No significant vascular findings are present. No enlarged abdominal or pelvic lymph nodes. Reproductive: Prostate is unremarkable. Other: No ascites, pneumoperitoneum or focal collection. Musculoskeletal: No acute or suspicious bony abnormalities. IMPRESSION: 1. Nonspecific heterogeneous liver without focal abnormality. This may represent mild hepatic steatosis, hepatitis or other etiology. Correlate clinically. No biliary dilatation. 2. Nonvisualized  spleen. Possible tiny amount of residual splenic tissue within the LEFT UPPER abdomen. Correlate with history. Electronically Signed   By: Margarette Canada M.D.   On: 01/23/2019 14:10    Procedures Procedures (including critical care time)  Medications Ordered in ED Medications  sodium chloride 0.9 % bolus 1,000 mL (0 mLs Intravenous Stopped 01/23/19 1101)  morphine 4 MG/ML injection 4 mg (4 mg Intravenous Given 01/23/19 1035)  ondansetron (ZOFRAN) injection 4 mg (4 mg Intravenous Given 01/23/19 1035)  ketorolac (TORADOL) 30 MG/ML injection 30 mg (30 mg Intravenous Given 01/23/19 1230)  iohexol (OMNIPAQUE) 300 MG/ML solution 100 mL (100 mLs Intravenous Contrast Given 01/23/19 1303)     Initial Impression / Assessment and Plan / ED Course  I have reviewed the triage vital signs and the nursing notes.  Pertinent labs & imaging results that were available during my care of the patient were reviewed by me and considered in my medical decision making (see chart for details).     Patient presenting for evaluation of cough, nausea, vomiting, diarrhea, and abdominal pain for 4 days.  He is afebrile, initially blood  pressures were soft with improvement with IV fluids.  He appears uncomfortable and overall ill.  Maximally tender to palpation in the right upper quadrant.  Lungs clear to auscultation bilaterally and chest x-ray shows no evidence of edema or consolidation.  Lab work reviewed by me shows mild nonspecific leukocytosis, no anemia, no metabolic derangements or renal insufficiency.  Lipase and LFTs within normal limits.  UA suggestive of dehydration but does not suggest UTI or nephrolithiasis.  He was given IV fluids, Zofran, and pain medicine in the ED with improvement.  CT of the abdomen and pelvis shows nonspecific heterogenous liver without focal abnormality.  Differential considerations include hepatic steatosis, hepatitis, or other etiology.  Given his LFTs are within normal limits I have a low suspicion of acute hepatitis.  No evidence of obstruction, perforation, appendicitis, cholecystitis, or dissection.  On reevaluation patient is resting comfortably in no apparent distress.  He is tolerating p.o. fluids.  Serial abdominal examinations remained benign.  Suspect viral process.  Will discharge with symptomatic management.  Discussed advancing diet slowly, pushing fluids.  Recommend follow-up PCP for reevaluation of symptoms if they persist.  Discussed strict ED return precautions Pt verbalized understanding of and agreement with plan and is safe for discharge home at this time.   Final Clinical Impressions(s) / ED Diagnoses   Final diagnoses:  Nausea vomiting and diarrhea  Right upper quadrant abdominal pain  Cough    ED Discharge Orders         Ordered    ondansetron (ZOFRAN ODT) 4 MG disintegrating tablet  Every 8 hours PRN     01/23/19 1434    benzonatate (TESSALON) 100 MG capsule  3 times daily PRN     01/23/19 1434    dicyclomine (BENTYL) 20 MG tablet  2 times daily PRN     01/23/19 1434           Renita Papa, PA-C 01/23/19 1454    Maudie Flakes, MD 01/23/19 1529

## 2019-01-23 NOTE — ED Notes (Signed)
Patient verbalized understanding of discharge instructions and denies any further needs or questions at this time. VS stable. Patient ambulatory with steady gait.  

## 2019-01-23 NOTE — Discharge Instructions (Signed)
1. Medications: Take [phone] mg of Tylenol every 6 hours as needed for pain/fever. Do not exceed 4000 mg of Tylenol daily. Take Zofran as needed for nausea.  Wait around 10-20 minutes before eating or drinking after taking this medication. Take bentyl as needed for abdominal cramping. Take tessalon as needed for cough.  2. Treatment: rest, drink plenty of fluids, advance diet slowly.  Start with water and broth then advance to bland foods that will not upset your stomach such as crackers, mashed potatoes, and peanut butter. 3. Follow Up: Please followup with your primary doctor in 3 days for discussion of your diagnoses and further evaluation after today's visit; if you do not have a primary care doctor use the resource guide provided to find one; Please return to the ER for persistent vomiting, high fevers or worsening symptoms

## 2019-01-23 NOTE — ED Triage Notes (Signed)
Patient c/o R sided abd pain, N/V/D x 4 days, fevers/chills off an on. He reports taking ibuprofen with some relief.  Adds syncopal episode in shower yesterday as well. Patient ambulatory to room with steady gait.

## 2019-01-24 LAB — PATHOLOGIST SMEAR REVIEW: Path Review: REACTIVE

## 2020-03-07 ENCOUNTER — Other Ambulatory Visit: Payer: Self-pay

## 2020-03-07 DIAGNOSIS — F17210 Nicotine dependence, cigarettes, uncomplicated: Secondary | ICD-10-CM | POA: Diagnosis not present

## 2020-03-07 DIAGNOSIS — Z8572 Personal history of non-Hodgkin lymphomas: Secondary | ICD-10-CM | POA: Insufficient documentation

## 2020-03-07 DIAGNOSIS — R079 Chest pain, unspecified: Secondary | ICD-10-CM | POA: Diagnosis not present

## 2020-03-07 DIAGNOSIS — M25522 Pain in left elbow: Secondary | ICD-10-CM | POA: Diagnosis not present

## 2020-03-07 DIAGNOSIS — F99 Mental disorder, not otherwise specified: Secondary | ICD-10-CM | POA: Diagnosis present

## 2020-03-07 NOTE — ED Triage Notes (Signed)
Pt states "my chest" when asked if he has pain anywhere but will not answer any other questions.
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
## 2020-03-07 NOTE — ED Notes (Signed)
Pt refused to have any work done, he refused EKG and blood work, Marine scientist was notified.

## 2020-03-08 ENCOUNTER — Emergency Department: Payer: Self-pay

## 2020-03-08 ENCOUNTER — Emergency Department
Admission: EM | Admit: 2020-03-08 | Discharge: 2020-03-08 | Disposition: A | Discharge status: Home / Self Care | Payer: Self-pay | Attending: Emergency Medicine | Admitting: Emergency Medicine

## 2020-03-08 DIAGNOSIS — M79602 Pain in left arm: Secondary | ICD-10-CM

## 2020-03-08 DIAGNOSIS — R079 Chest pain, unspecified: Secondary | ICD-10-CM

## 2020-03-08 LAB — BASIC METABOLIC PANEL
Anion gap: 9 (ref 5–15)
BUN: 11 mg/dL (ref 6–20)
CO2: 27 mmol/L (ref 22–32)
Calcium: 9.4 mg/dL (ref 8.9–10.3)
Chloride: 104 mmol/L (ref 98–111)
Creatinine, Ser: 0.8 mg/dL (ref 0.61–1.24)
GFR calc Af Amer: >60 mL/min (ref >60)
GFR calc non Af Amer: >60 mL/min (ref >60)
Glucose, Bld: 106 mg/dL — ABNORMAL HIGH (ref 70–99)
Potassium: 4 mmol/L (ref 3.5–5.1)
Sodium: 140 mmol/L (ref 135–145)

## 2020-03-08 LAB — CBC
HCT: 38.9 % — ABNORMAL LOW (ref 39.0–52.0)
Hemoglobin: 12.7 g/dL — ABNORMAL LOW (ref 13.0–17.0)
MCH: 28.8 pg (ref 26.0–34.0)
MCHC: 32.6 g/dL (ref 30.0–36.0)
MCV: 88.2 fL (ref 80.0–100.0)
Platelets: 246 10*3/uL (ref 150–400)
RBC: 4.41 MIL/uL (ref 4.22–5.81)
RDW: 13.8 % (ref 11.5–15.5)
WBC: 13.4 10*3/uL — ABNORMAL HIGH (ref 4.0–10.5)
nRBC: 0 % (ref 0.0–0.2)

## 2020-03-08 LAB — TROPONIN I (HIGH SENSITIVITY)
Troponin I (High Sensitivity): 2 ng/L (ref <18)
Troponin I (High Sensitivity): 2 ng/L (ref <18)

## 2020-03-08 MED ORDER — IBUPROFEN 400 MG PO TABS
400.0000 mg | ORAL_TABLET | Freq: Once | ORAL | Status: AC
  Administered 2020-03-08: 05:00:00 400 mg via ORAL
  Filled 2020-03-08: qty 1

## 2020-03-08 MED ORDER — ACETAMINOPHEN 325 MG PO TABS
650.0000 mg | ORAL_TABLET | Freq: Once | ORAL | Status: AC
  Administered 2020-03-08: 650 mg via ORAL
  Filled 2020-03-08: qty 2

## 2020-03-08 NOTE — ED Notes (Signed)
Xray tech at bedside.

## 2020-03-08 NOTE — ED Provider Notes (Signed)
[facility] Department Provider Note  ____________________________________________   First MD Initiated Contact with Patient 03/08/20 0407     (approximate)  I have reviewed the triage vital signs and the nursing notes.   HISTORY  Chief Complaint medical clearance    HPI Austin Townsend is a 39 y.o. 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  Patient was arrested for [place] [employer].  Per the police patient began having a "fake seizure" where they sternal rub him and he immediately woke up and was not postictal, no evidence of bowel or bladder incontinence, no oral trauma.  Patient then was reporting some chest discomfort.  Patient was initially refusing blood work and EKG but eventually was able to comply.  Upon me seeing patient patient is asleep in bed. Patient states that he is having pain in his arm. Initially states that his pain is in his right arm, the entire arm, severe, constant, but then when I push on his arm does not really seem to be in any discomfort. However when I push on his left arm he states is having even worse pain in his left elbow. He does have pain when I try to range it. No pain in the shoulder from what I can tell. No shortness of breath, no abdominal pain, fevers.  Patient states that he had some chest pain earlier but does not only have any currently.          Past Medical History:  Diagnosis Date  . 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   Past Surgical History:  Procedure Laterality Date  . SPLEENEC      Prior to Admission medications   Medication Sig Start Date End Date Taking? Authorizing Provider  benzonatate (TESSALON) 100 MG capsule Take 1 capsule (100 mg total) by mouth 3 (three) times daily as needed for cough. 01/23/19   Fawze, Mina A, PA-C  dicyclomine (BENTYL) 20 MG tablet  Take 1 tablet (20 mg total) by mouth 2 (two) times daily as needed for spasms. 01/23/19   Nils Flack, Mina A, PA-C  ondansetron (ZOFRAN ODT) 4 MG disintegrating tablet Take 1 tablet (4 mg total) by mouth every 8 (eight) hours as needed for nausea or vomiting. 01/23/19   Rodell Perna A, PA-C    Allergies Patient has no known allergies.  No family history on file.  Social History Social History   Tobacco Use  . Smoking status: Current Every Day Smoker    Packs/day: 1.00    Types: Cigarettes  . Smokeless tobacco: Never Used  Substance Use Topics  . Alcohol use: Yes    Comment: occ  . Drug use: No      Review of Systems Constitutional: No fever/chills Eyes: No visual changes. ENT: No sore throat. Cardiovascular: Positive chest pain Respiratory: Denies shortness of breath. Gastrointestinal: No abdominal pain.  No nausea, no vomiting.  No diarrhea.  No constipation. Genitourinary: Negative for dysuria. Musculoskeletal: Negative for back pain. Positive arm pain Skin: Negative for rash. Neurological: Negative for headaches, focal weakness or numbness. All other ROS negative ____________________________________________   PHYSICAL EXAM:  VITAL SIGNS: ED Triage Vitals [03/07/20 2322]  Enc Vitals Group     BP 134/60     Pulse Rate (!) 114     Resp 16  Temp 98 F (36.7 C)     Temp Source Oral     SpO2 99 %     Weight 150 lb (68 kg)     Height 5\' 9"  (1.753 m)     Head Circumference      Peak Flow      Pain Score      Pain Loc      Pain Edu?      Excl. in [place]?     Constitutional: Alert and oriented. Well appearing and in no acute distress. Patient is asleep but easily woken Eyes: Conjunctivae are normal. EOMI. Head: Atraumatic. Nose: No congestion/rhinnorhea. Mouth/Throat: Mucous membranes are moist.   Neck: No stridor. Trachea Midline. FROM Cardiovascular: Tachycardic in triage but now normal, regular rhythm. Grossly normal heart sounds.  Good peripheral  circulation. Respiratory: Normal respiratory effort.  No retractions. Lungs CTAB. Gastrointestinal: Soft and nontender. No distention. No abdominal bruits.  Musculoskeletal: No lower extremity tenderness nor edema.  No joint effusions. Pain at the left elbow. Good distal pulses bilaterally. No obvious signs of deformity. Some very superficial abrasions on his right wrist from handcuffs but able to range his wrist fully Neurologic:  Normal speech and language. No gross focal neurologic deficits are appreciated.  Skin:  Skin is warm, dry and intact. No rash noted. Psychiatric: Mood and affect are normal. Speech and behavior are normal. GU: Deferred   ____________________________________________   LABS (all labs ordered are listed, but only abnormal results are displayed)  Labs Reviewed  BASIC METABOLIC PANEL - Abnormal; Notable for the following components:      Result Value   Glucose, Bld 106 (*)    All other components within normal limits  CBC - Abnormal; Notable for the following components:   WBC 13.4 (*)    Hemoglobin 12.7 (*)    HCT 38.9 (*)    All other components within normal limits  TROPONIN I (HIGH SENSITIVITY)  TROPONIN I (HIGH SENSITIVITY)   ____________________________________________   ED ECG REPORT I, Vanessa Judson, the attending physician, personally viewed and interpreted this ECG.  EKG is normal sinus rate of 75, no ST elevation, no T wave inversions, normal intervals ____________________________________________  RADIOLOGY Robert Bellow, personally viewed and evaluated these images (plain radiographs) as part of my medical decision making, as well as reviewing the written report by the radiologist.  ED MD interpretation: No pneumonia  Official radiology report(s): DG Elbow Complete Left  Result Date: 03/08/2020 CLINICAL DATA:  Arm pain EXAM: LEFT ELBOW - COMPLETE 3+ VIEW COMPARISON:  None. FINDINGS: There is no evidence of fracture, dislocation, or joint  effusion. There is no evidence of arthropathy or other focal bone abnormality. Soft tissues are unremarkable. IMPRESSION: Negative. Electronically Signed   By: Ulyses Jarred M.D.   On: 03/08/2020 04:48   DG Chest Portable 1 View  Result Date: 03/08/2020 CLINICAL DATA:  Chest pain EXAM: PORTABLE CHEST 1 VIEW COMPARISON:  01/23/2019 FINDINGS: The heart size and mediastinal contours are within normal limits. Both lungs are clear. The visualized skeletal structures are unremarkable. IMPRESSION: No active disease. Electronically Signed   By: Ulyses Jarred M.D.   On: 03/08/2020 04:59    ____________________________________________   PROCEDURES  Procedure(s) performed (including Critical Care):  Procedures   ____________________________________________   INITIAL IMPRESSION / ASSESSMENT AND PLAN / ED COURSE  Austin Townsend was evaluated in Emergency Department on 03/08/2020 for the symptoms described in the history of present illness. He was evaluated  in the context of the global COVID-19 pandemic, which necessitated consideration that the patient might be at risk for infection with the SARS-CoV-2 virus that causes COVID-19. Institutional protocols and algorithms that pertain to the evaluation of patients at risk for COVID-19 are in a state of rapid change based on information released by regulatory bodies including the CDC and federal and state organizations. These policies and algorithms were followed during the patient's care in the ED.    Patient is a 39 year old who is being taken to jail who comes in with initially report of a seizure that does not sound real given patient was not postictal, no urinary incontinence, no tongue biting.  Then reported chest pain so EKG and cardiac markers were evaluated for ACS, chest x-ray was to evaluate for pneumothorax.  Then reported right arm pain but patient did not really seem any tender on exam had good distal pulses without evidence of significant trauma.   Some small abrasions on his wrist from handcuffs from the police.  Then reported left elbow pain with difficulty ranging the elbow.  No effusion to suggest septic joint.  X-ray without evidence of fracture.  Suspect that there is a component of malingering.  Patient given some Tylenol and ibuprofen to help with discomfort.  Patient was initially tachycardic but I suspect that was more from the agitation of being recently arrested.  Heart rates have come down and patient is resting comfortably.  Is not feeling short of breath I have low suspicion for PE.  White count is elevated at 13.4 but no other infectious symptoms and patient has no other sirs criteria.  Most likely reactive in nature.  Cardiac markers are negative x2  Given reassuring work-up at this time patient is medically cleared.       ____________________________________________   FINAL CLINICAL IMPRESSION(S) / ED DIAGNOSES   Final diagnoses:  Chest pain, unspecified type  Pain of left upper extremity      MEDICATIONS GIVEN DURING THIS VISIT:  Medications  acetaminophen (TYLENOL) tablet 650 mg (has no administration in time range)  ibuprofen (ADVIL) tablet 400 mg (has no administration in time range)     ED Discharge Orders    None       Note:  This document was prepared using Dragon voice recognition software and may include unintentional dictation errors.   Vanessa Marina, MD 03/08/20 [phone]

## 2020-03-08 NOTE — ED Notes (Signed)
Pt states will consent to blood work and ekg at this time.

## 2020-03-08 NOTE — ED Notes (Signed)
Pt continues to decline ekg and blood work.

## 2020-03-08 NOTE — ED Notes (Signed)
Reviewed discharge instructions, follow-up care, and OTC pain medications with patient. Patient verbalized understanding of all information reviewed. Patient stable, with no distress noted at this time.    Officer at bedside. Patient discharge to care of officer.

## 2020-03-08 NOTE — ED Notes (Signed)
Patient c/o generalized arm pain and abdominal pain.  MD and officer at bedside.

## 2020-03-08 NOTE — Discharge Instructions (Addendum)
Your cardiac markers and EKG were reassuring.  Your x-rays were negative.  You can take Tylenol 1 g every 8 hours and ibuprofen 400 every 6 hours with food to help with your discomfort.  Return to the ER for any other concerns

## 2021-08-28 IMAGING — DX DG ELBOW COMPLETE 3+V*L*
4 series · 4 of 4 positions shown · non-contrast
Comparison: None.

CLINICAL DATA: Arm pain

EXAM:
LEFT ELBOW - COMPLETE 3+ VIEW

[elbow ap]
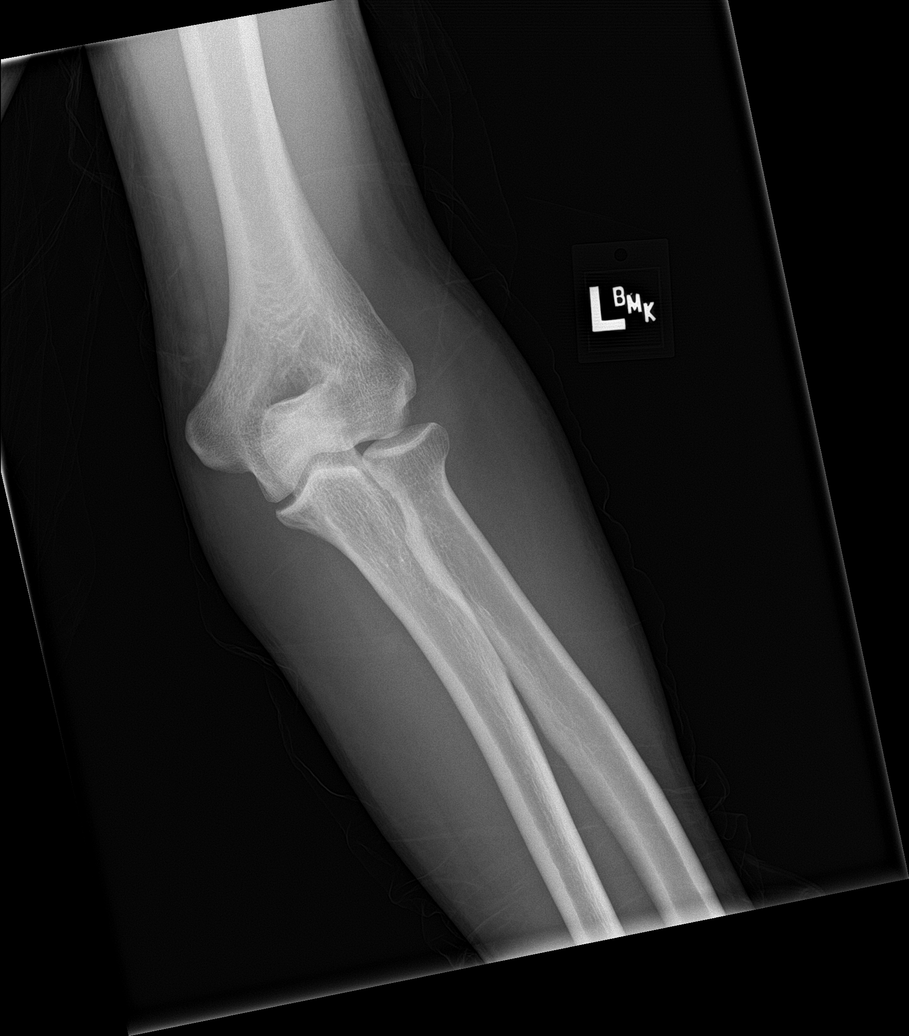

[elbow obl (1 of 2)]
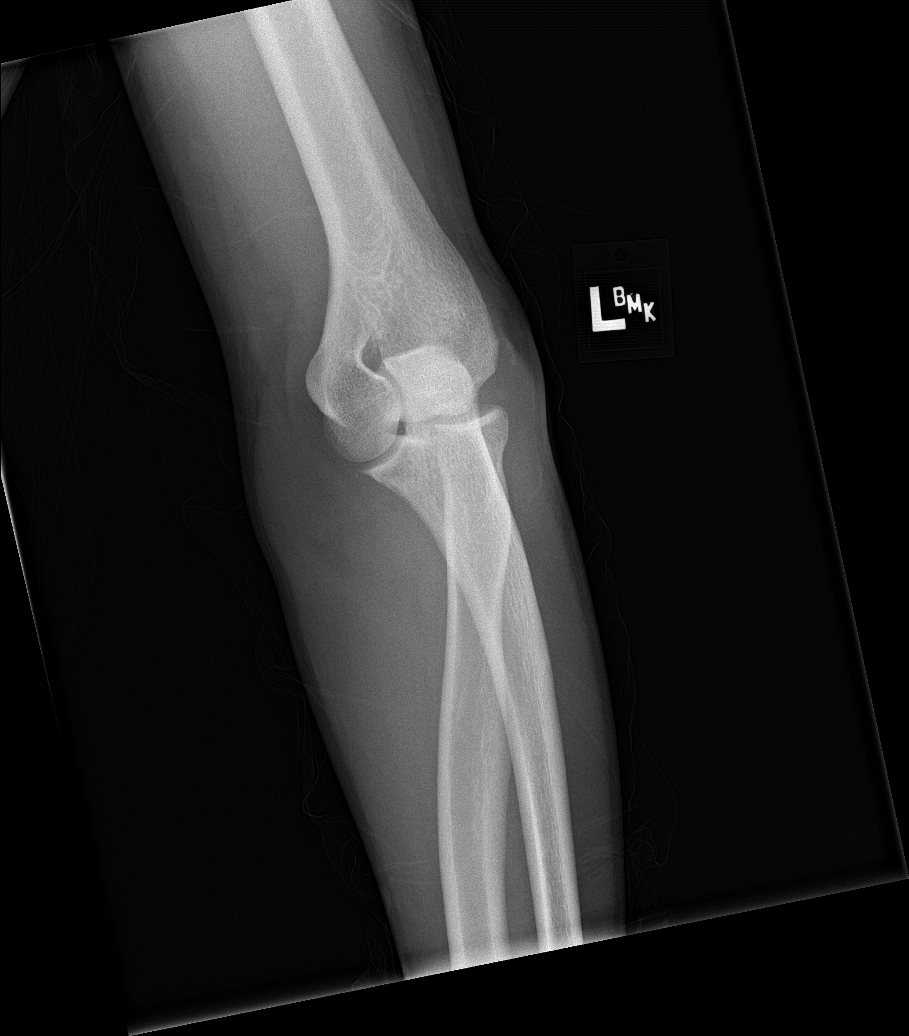

[elbow obl (2 of 2)]
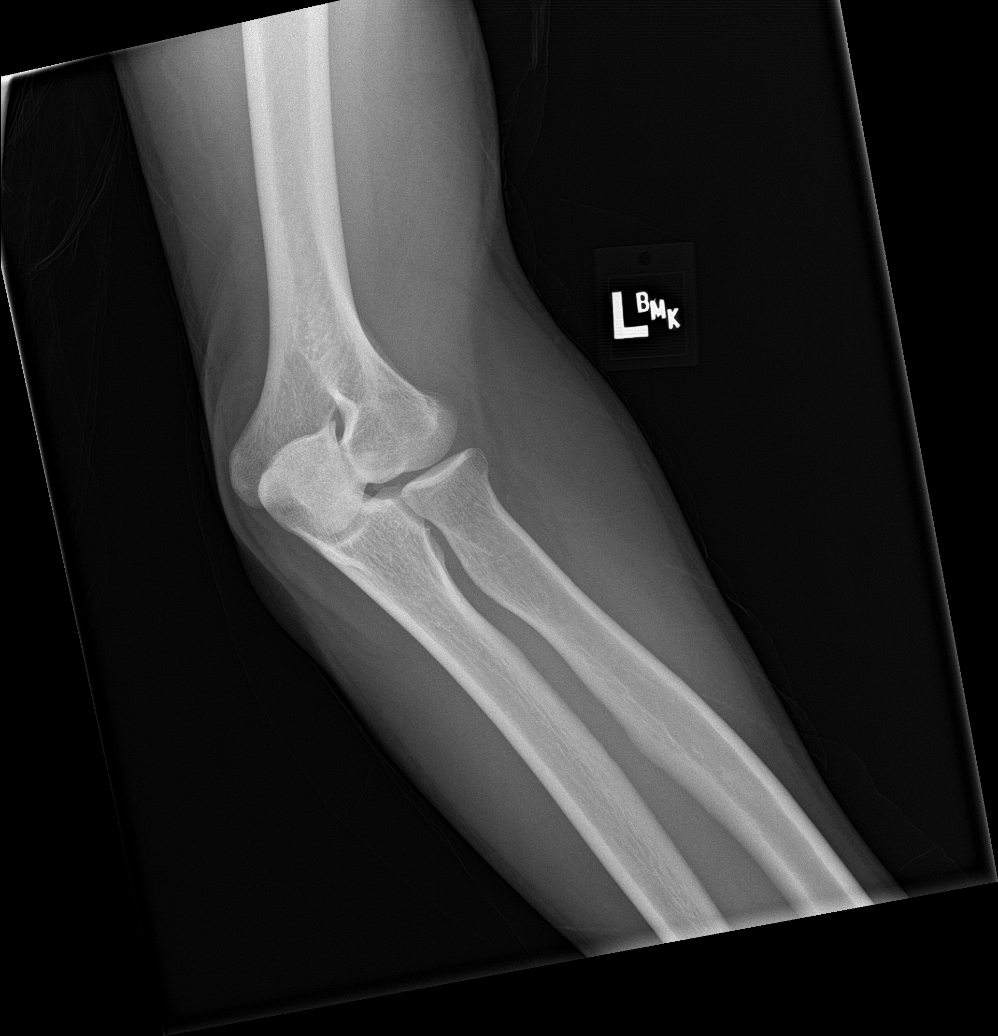

[elbow lat]
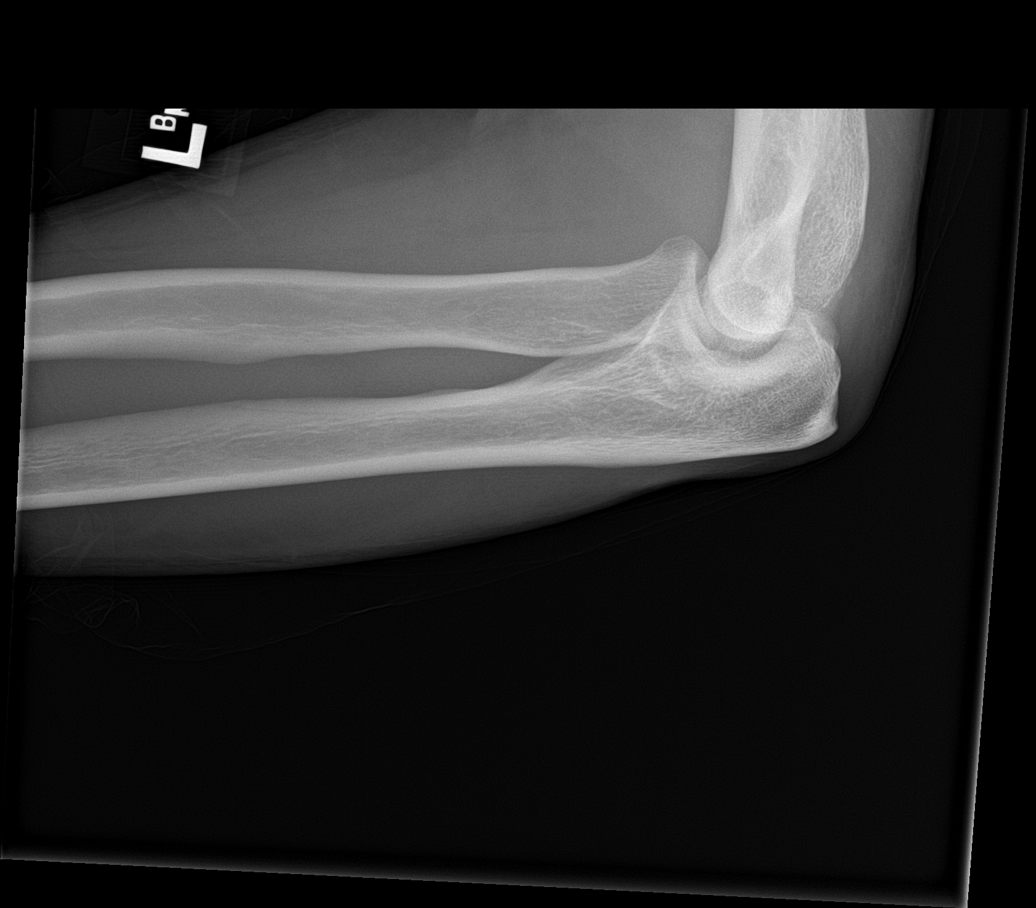

[4 of 4 positions shown; findings below may reference images not displayed]

FINDINGS: There is no evidence of fracture, dislocation, or joint effusion.
There is no evidence of arthropathy or other focal bone abnormality.
Soft tissues are unremarkable.
IMPRESSION: Negative.

## 2021-08-28 IMAGING — DX DG CHEST 1V PORT
1 series · 1 of 1 positions shown · non-contrast
Comparison: 01/23/2019

CLINICAL DATA: Chest pain

EXAM:
PORTABLE CHEST 1 VIEW

[chest ap]
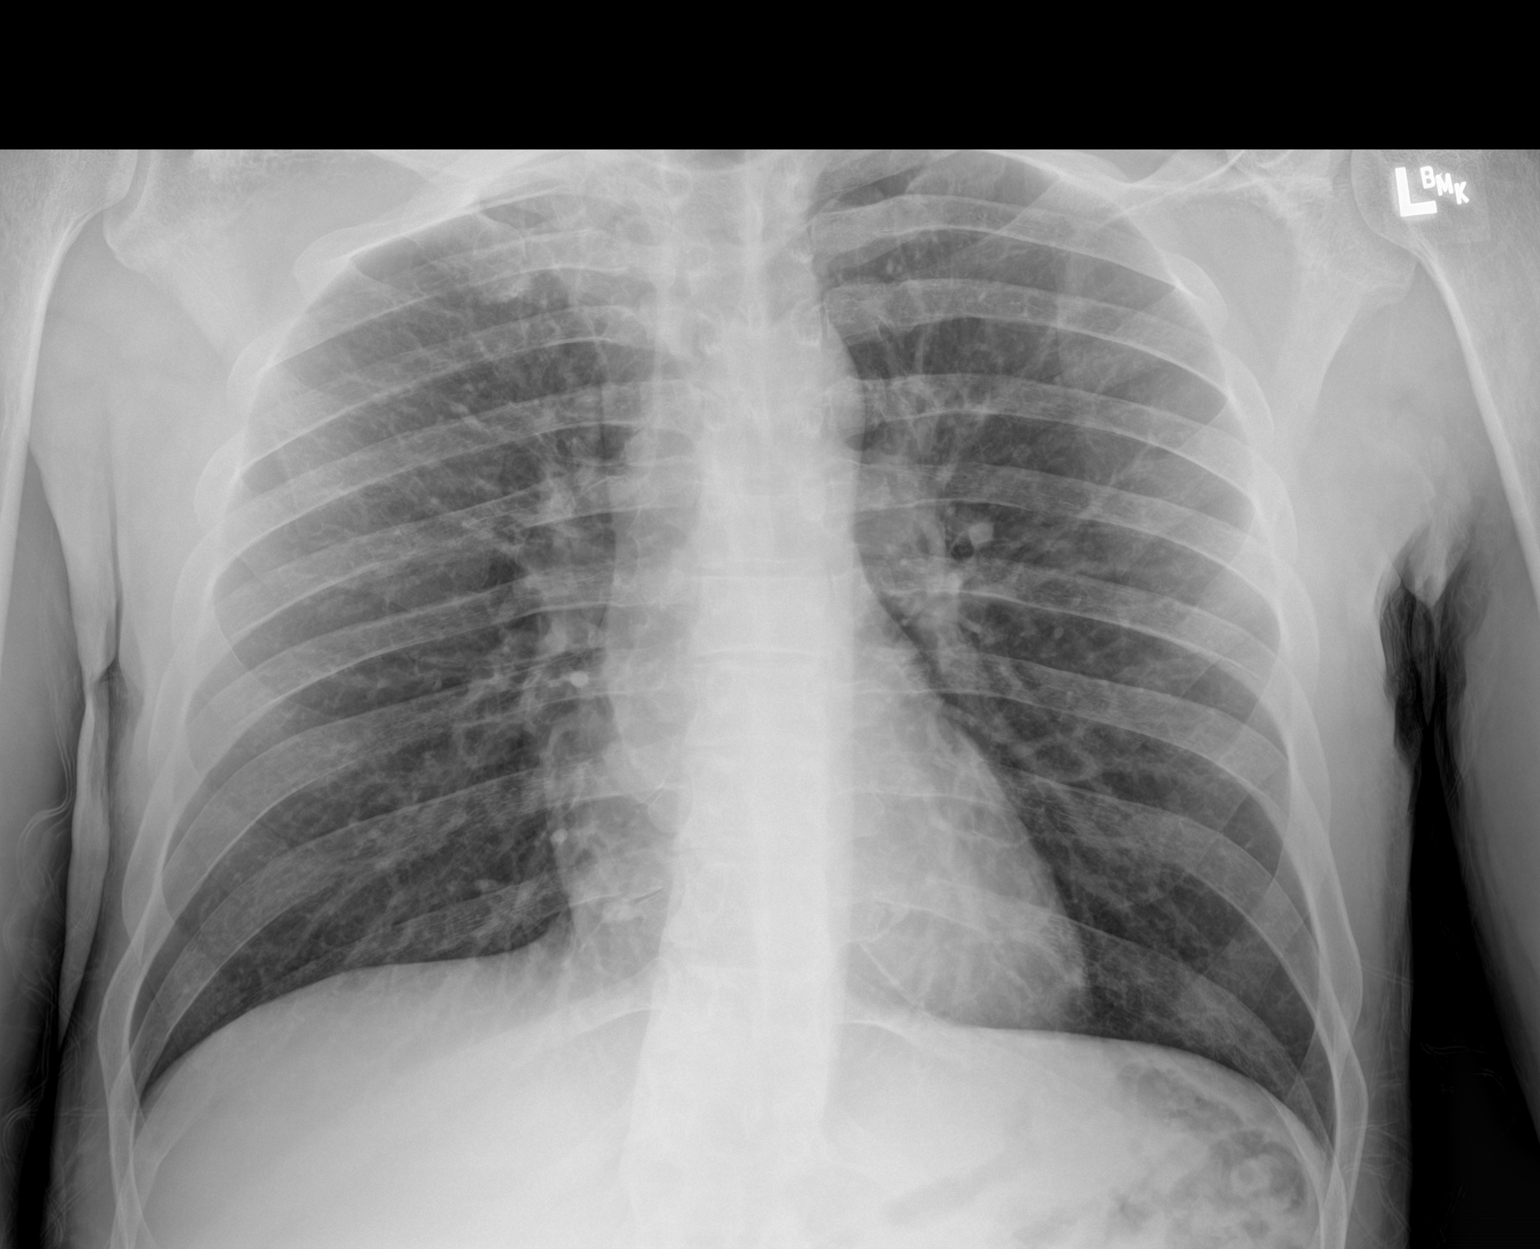

[1 of 1 positions shown; findings below may reference images not displayed]

FINDINGS: The heart size and mediastinal contours are within normal limits.
Both lungs are clear. The visualized skeletal structures are
unremarkable.
IMPRESSION: No active disease.
